# Patient Record
Sex: Male | Born: 2005 | Race: White | Hispanic: No | Marital: Single
Health system: Southern US, Community
[De-identification: ages and names within clinical notes are randomized; demographics above are authoritative.]

---
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
## 2023-09-16 ENCOUNTER — Ambulatory Visit: Payer: Medicaid Other | Admitting: Podiatry

## 2023-09-21 ENCOUNTER — Ambulatory Visit (INDEPENDENT_AMBULATORY_CARE_PROVIDER_SITE_OTHER): Payer: Medicaid Other | Admitting: Podiatry

## 2023-09-21 DIAGNOSIS — M2141 Flat foot [pes planus] (acquired), right foot: Secondary | ICD-10-CM

## 2023-09-21 DIAGNOSIS — M2142 Flat foot [pes planus] (acquired), left foot: Secondary | ICD-10-CM | POA: Diagnosis not present

## 2023-09-28 NOTE — Progress Notes (Signed)
   Chief Complaint  Patient presents with   Foot Orthotics    Requesting new orthotics, [facility]. Patient stated he has been using his old pair of orthotics and they have helped with his walking and knees.     Subjective:  Pediatric patient presents today for follow-up evaluation of bilateral flatfeet.  Patient states that the orthotics received helped significantly.  They are requesting new orthotics today.   No past medical history on file. No past surgical history on file No Known Allergies  Objective/Physical Exam General: The patient is alert and oriented x3 in no acute distress.  Dermatology: Skin is warm, dry and supple bilateral lower extremities. Negative for open lesions or macerations.  Vascular: Palpable pedal pulses bilaterally. No edema or erythema noted. Capillary refill within normal limits.  Neurological: Grossly intact via light touch  Musculoskeletal Exam: Flexible joint range of motion noted with excessive pronation during weightbearing. Moderate calcaneal valgus with medial longitudinal arch collapse noted upon weightbearing. Activation of windlass mechanism indicates flexibility of the medial longitudinal arch. Muscle strength 5/5 in all groups bilateral.   Assessment: #1 flexible pes planus bilateral #2 calcaneal valgus deformity bilateral #3 pain in bilateral feet #4 equinus deformity bilateral   Plan of Care:  -Patient was evaluated. Comprehensive lower extremity biomechanical evaluation performed. X-rays reviewed today. -Orthotics in the past have helped tremendously alleviate the patient's symptoms and pain. -New prescription for custom molded orthotics to take to [facility]  -Return to clinic as needed  Felecia Shelling, DPM [facility]  Dr. Felecia Shelling, DPM    [address]                                        [place], [place] [address]                Office [phone]  Fax [phone]
